# Patient Record
Sex: Female | Born: 1950
Health system: Southern US, Community
[De-identification: ages and names within clinical notes are randomized; demographics above are authoritative.]

## PROBLEM LIST (undated history)

## (undated) DIAGNOSIS — M8580 Other specified disorders of bone density and structure, unspecified site: Secondary | ICD-10-CM

## (undated) DIAGNOSIS — N3941 Urge incontinence: Secondary | ICD-10-CM

## (undated) DIAGNOSIS — K219 Gastro-esophageal reflux disease without esophagitis: Secondary | ICD-10-CM

## (undated) DIAGNOSIS — E7800 Pure hypercholesterolemia, unspecified: Secondary | ICD-10-CM

## (undated) DIAGNOSIS — E039 Hypothyroidism, unspecified: Secondary | ICD-10-CM

## (undated) DIAGNOSIS — K4090 Unilateral inguinal hernia, without obstruction or gangrene, not specified as recurrent: Secondary | ICD-10-CM

## (undated) DIAGNOSIS — N841 Polyp of cervix uteri: Secondary | ICD-10-CM

## (undated) HISTORY — DX: Other specified disorders of bone density and structure, unspecified site: M85.80

## (undated) HISTORY — DX: Hypothyroidism, unspecified: E03.9

## (undated) HISTORY — DX: Urge incontinence: N39.41

## (undated) HISTORY — PX: HERNIA REPAIR: SHX51

## (undated) HISTORY — DX: Pure hypercholesterolemia, unspecified: E78.00

## (undated) HISTORY — DX: Polyp of cervix uteri: N84.1

## (undated) HISTORY — DX: Gastro-esophageal reflux disease without esophagitis: K21.9

## (undated) HISTORY — DX: Unilateral inguinal hernia, without obstruction or gangrene, not specified as recurrent: K40.90

---

## 1998-08-13 ENCOUNTER — Encounter: Payer: Self-pay | Admitting: Family Medicine

## 1998-08-13 ENCOUNTER — Ambulatory Visit (HOSPITAL_COMMUNITY): Admission: RE | Admit: 1998-08-13 | Discharge: 1998-08-13 | Payer: Self-pay | Admitting: Family Medicine

## 2002-01-24 ENCOUNTER — Ambulatory Visit (HOSPITAL_COMMUNITY): Admission: RE | Admit: 2002-01-24 | Discharge: 2002-01-24 | Payer: Self-pay | Admitting: Family Medicine

## 2002-01-24 ENCOUNTER — Encounter: Payer: Self-pay | Admitting: Family Medicine

## 2003-06-20 ENCOUNTER — Emergency Department (HOSPITAL_COMMUNITY): Admission: EM | Admit: 2003-06-20 | Discharge: 2003-06-20 | Payer: Self-pay | Admitting: *Deleted

## 2004-09-12 ENCOUNTER — Other Ambulatory Visit: Admission: RE | Admit: 2004-09-12 | Discharge: 2004-09-12 | Payer: Self-pay | Admitting: Family Medicine

## 2005-11-27 ENCOUNTER — Other Ambulatory Visit: Admission: RE | Admit: 2005-11-27 | Discharge: 2005-11-27 | Payer: Self-pay | Admitting: Family Medicine

## 2011-05-28 ENCOUNTER — Other Ambulatory Visit (HOSPITAL_COMMUNITY)
Admission: RE | Admit: 2011-05-28 | Discharge: 2011-05-28 | Disposition: A | Discharge status: Home / Self Care | Payer: BC Managed Care – PPO | Source: Ambulatory Visit | Attending: Family Medicine | Admitting: Family Medicine

## 2011-05-28 DIAGNOSIS — Z0000 Encounter for general adult medical examination without abnormal findings: Secondary | ICD-10-CM | POA: Insufficient documentation

## 2015-01-22 ENCOUNTER — Other Ambulatory Visit (HOSPITAL_COMMUNITY)
Admission: RE | Admit: 2015-01-22 | Discharge: 2015-01-22 | Disposition: A | Discharge status: Home / Self Care | Payer: BLUE CROSS/BLUE SHIELD | Source: Ambulatory Visit | Attending: Family Medicine | Admitting: Family Medicine

## 2015-01-22 ENCOUNTER — Other Ambulatory Visit: Payer: Self-pay | Admitting: Family Medicine

## 2015-01-22 DIAGNOSIS — Z01419 Encounter for gynecological examination (general) (routine) without abnormal findings: Secondary | ICD-10-CM | POA: Insufficient documentation

## 2015-01-24 LAB — CYTOLOGY - PAP

## 2015-10-24 DIAGNOSIS — N3941 Urge incontinence: Secondary | ICD-10-CM | POA: Diagnosis not present

## 2015-10-24 DIAGNOSIS — Z23 Encounter for immunization: Secondary | ICD-10-CM | POA: Diagnosis not present

## 2015-10-24 DIAGNOSIS — E7800 Pure hypercholesterolemia, unspecified: Secondary | ICD-10-CM | POA: Diagnosis not present

## 2015-10-24 DIAGNOSIS — E039 Hypothyroidism, unspecified: Secondary | ICD-10-CM | POA: Diagnosis not present

## 2016-02-26 DIAGNOSIS — R0789 Other chest pain: Secondary | ICD-10-CM | POA: Diagnosis not present

## 2016-02-26 DIAGNOSIS — K219 Gastro-esophageal reflux disease without esophagitis: Secondary | ICD-10-CM | POA: Diagnosis not present

## 2016-02-26 DIAGNOSIS — Z23 Encounter for immunization: Secondary | ICD-10-CM | POA: Diagnosis not present

## 2016-02-28 ENCOUNTER — Telehealth: Payer: Self-pay

## 2016-02-28 NOTE — Telephone Encounter (Signed)
SENT NOTES TO SCHEDULING 02/28/16 RJ

## 2016-02-28 NOTE — Telephone Encounter (Signed)
SENT NOTES TO SCHEDULING 02/28/16 RJ 

## 2016-02-29 ENCOUNTER — Encounter: Payer: Self-pay | Admitting: Internal Medicine

## 2016-03-03 ENCOUNTER — Encounter: Payer: Self-pay | Admitting: Interventional Cardiology

## 2016-03-03 ENCOUNTER — Ambulatory Visit (INDEPENDENT_AMBULATORY_CARE_PROVIDER_SITE_OTHER): Payer: PPO | Admitting: Interventional Cardiology

## 2016-03-03 ENCOUNTER — Ambulatory Visit: Payer: BLUE CROSS/BLUE SHIELD | Admitting: Internal Medicine

## 2016-03-03 VITALS — BP 144/90 | HR 73 | Ht 63.0 in | Wt 206.4 lb

## 2016-03-03 DIAGNOSIS — Z8249 Family history of ischemic heart disease and other diseases of the circulatory system: Secondary | ICD-10-CM | POA: Diagnosis not present

## 2016-03-03 DIAGNOSIS — E785 Hyperlipidemia, unspecified: Secondary | ICD-10-CM | POA: Diagnosis not present

## 2016-03-03 DIAGNOSIS — K219 Gastro-esophageal reflux disease without esophagitis: Secondary | ICD-10-CM

## 2016-03-03 DIAGNOSIS — R112 Nausea with vomiting, unspecified: Secondary | ICD-10-CM

## 2016-03-03 DIAGNOSIS — R079 Chest pain, unspecified: Secondary | ICD-10-CM

## 2016-03-03 DIAGNOSIS — I10 Essential (primary) hypertension: Secondary | ICD-10-CM

## 2016-03-03 NOTE — Progress Notes (Signed)
Cardiology Office Note    Date:  03/03/2016   ID:  Jasmine SimmondsValarie D Dahm, DOB 22-Sep-1950, MRN [id number]  PCP:  No primary care provider on file.  Cardiologist: Lesleigh NoeHenry W Smith III, MD   Chief Complaint  Patient presents with  . Chest Pain    History of Present Illness:  Jasmine Jordan is a 65 y.o. female referred for evaluation of chest discomfort.  The chest discomfort is described as an upper substernal sensation of fullness. The discomfort is generally present at rest and after eating. When it is there, if she gets up and moves about the discomfort gets better. It has never been precipitated by physical activity. There are no associated symptoms such as arm discomfort, palpitations, or dyspnea. She is physically active. She has not had palpitations or syncope. The current symptom has been present intermittently for the past 2 months. She walks up to 3 miles per day and has had no difficulty with inciting the symptoms. She denies dyspnea.  Unassociated with the above chest discomfort, she had an episode of diaphoresis nausea and vomiting 2 weeks ago. It occurred spontaneously. Not associated with chest discomfort, dyspnea, or other complaint. It has not recurred since that time.    Past Medical History:  Diagnosis Date  . Gastroesophageal reflux disease without esophagitis   . Hypothyroidism   . Osteopenia    MILD  . Polyp of cervix uteri   . Pure hypercholesterolemia   . Unilateral inguinal hernia without obstruction or gangrene   . Urge incontinence     Past Surgical History:  Procedure Laterality Date  . HERNIA REPAIR     LEFT GROIN BTL    Current Medications: Outpatient Medications Prior to Visit  Medication Sig Dispense Refill  . atorvastatin (LIPITOR) 40 MG tablet Take 40 mg by mouth daily.    Marland Kitchen. levothyroxine (SYNTHROID, LEVOTHROID) 125 MCG tablet Take 125 mcg by mouth daily before breakfast.    . solifenacin (VESICARE) 10 MG tablet Take 10 mg by mouth daily.        No facility-administered medications prior to visit.      Allergies:   Patient has no known allergies.   Social History   Social History  . Marital status: Married    Spouse name: Gerlene BurdockRICHARD  . Number of children: 4  . Years of education: COLLEGE   Occupational History  . [employer]    Social History Main Topics  . Smoking status: Never Smoker  . Smokeless tobacco: Never Used  . Alcohol use Yes  . Drug use: No  . Sexual activity: Not Asked   Other Topics Concern  . None   Social History Narrative  . None     Family History:  The patient's family history includes Aneurysm in her maternal grandmother; Atrial fibrillation in her mother; CAD in her father and mother; COPD in her mother; Cancer in her maternal grandfather; Emphysema (age of onset: 6381) in her mother; Heart disease in her father and mother.   ROS:   Please see the history of present illness.    There is a history of gastroesophageal reflux. She has occasional reflux. She denies dysphagia. History of joint aches. History of osteopenia. Has been actively treating elevated lipids for greater than 10 years. They has a family history of premature vascular disease.  All other systems reviewed and are negative.   PHYSICAL EXAM:   VS:  BP (!) 144/90 (BP Location: Right Arm)   Pulse 73  Ht 5\' 3"  (1.6 m)   Wt 206 lb 6.4 oz (93.6 kg)   BMI 36.56 kg/m    GEN: Well nourished, well developed, in no acute distress . Moderate obesity. HEENT: normal  Neck: no JVD, carotid bruits, or masses Cardiac: RRR; no murmurs, rubs, or gallops,no edema  Respiratory:  clear to auscultation bilaterally, normal work of breathing GI: soft, nontender, nondistended, + BS MS: no deformity or atrophy  Skin: warm and dry, no rash Neuro:  Alert and Oriented x 3, Strength and sensation are intact Psych: euthymic mood, full affect  Wt Readings from Last 3 Encounters:  03/03/16 206 lb 6.4 oz (93.6 kg)      Studies/Labs Reviewed:    EKG:  EKG  Normal sinus rhythm, leftward axis, otherwise normal.  Recent Labs: No results found for requested labs within last 8760 hours.   Lipid Panel No results found for: CHOL, TRIG, HDL, CHOLHDL, VLDL, LDLCALC, LDLDIRECT  Additional studies/ records that were reviewed today include:  No cardiac data is available    ASSESSMENT:    1. Chest pain, unspecified type   2. Essential hypertension   3. Hyperlipidemia with target LDL less than 100   4. Family history of early CAD   5. Gastroesophageal reflux disease, esophagitis presence not specified   6. Nausea and vomiting, intractability of vomiting not specified, unspecified vomiting type      PLAN:  In order of problems listed above:  1. Chest discomfort is atypical for coronary disease. There are no features to suggest exertional angina. In fact her symptoms improve with physical activity. Episodes last less than 15 minutes. Given her family history and other risk factors including hyperlipidemia and hypertension, and excise treadmill test will be performed. 2. Blood pressure is elevated today. I discussed low-salt diet. We will see how the blood pressure does on treadmill. 3. Continue statin therapy. 4. Addressed above. 5. Not addressed.. 6. Not addressed but likely related to reflux.    Medication Adjustments/Labs and Tests Ordered: Current medicines are reviewed at length with the patient today.  Concerns regarding medicines are outlined above.  Medication changes, Labs and Tests ordered today are listed in the Patient Instructions below. Patient Instructions  Medication Instructions:  None  Labwork: None  Testing/Procedures: Your physician has requested that you have an exercise tolerance test. For further information please visit [email or web address]. Please also follow instruction sheet, as given.   Follow-Up: Your physician recommends that you schedule a follow-up appointment as needed with Dr. Katrinka BlazingSmith.    Any Other Special Instructions Will Be Listed Below (If Applicable).     If you need a refill on your cardiac medications before your next appointment, please call your pharmacy.      Signed, Lesleigh NoeHenry W Smith III, MD  03/03/2016 3:57 PM    [facility] [address], [place], [place]  [address] Phone: [phone]; Fax: [phone]

## 2016-03-03 NOTE — Patient Instructions (Signed)
Medication Instructions:  None  Labwork: None  Testing/Procedures: Your physician has requested that you have an exercise tolerance test. For further information please visit [email or web address]. Please also follow instruction sheet, as given.   Follow-Up: Your physician recommends that you schedule a follow-up appointment as needed with Dr. Smith.    Any Other Special Instructions Will Be Listed Below (If Applicable).     If you need a refill on your cardiac medications before your next appointment, please call your pharmacy.   

## 2016-03-17 ENCOUNTER — Ambulatory Visit (INDEPENDENT_AMBULATORY_CARE_PROVIDER_SITE_OTHER): Payer: PPO

## 2016-03-17 DIAGNOSIS — R079 Chest pain, unspecified: Secondary | ICD-10-CM | POA: Diagnosis not present

## 2016-03-21 ENCOUNTER — Telehealth: Payer: Self-pay | Admitting: Interventional Cardiology

## 2016-03-21 LAB — EXERCISE TOLERANCE TEST
CHL CUP MPHR: 155 {beats}/min
CHL CUP RESTING HR STRESS: 65 {beats}/min
CSEPEDS: 27 s
CSEPEW: 7.6 METS
CSEPHR: 94 %
CSEPPHR: 146 {beats}/min
Exercise duration (min): 6 min
RPE: 13

## 2016-03-21 NOTE — Telephone Encounter (Signed)
New message   Pt verbalized that she is calling for th results for the 12/4 monitor

## 2016-03-21 NOTE — Telephone Encounter (Signed)
Left message for pt letting her know I will call as soon as I have those results.  Pt is calling about ETT results. Not sure if they were ever sent to you.  They are available in EPIC.

## 2016-03-25 ENCOUNTER — Telehealth: Payer: Self-pay | Admitting: *Deleted

## 2016-03-25 MED ORDER — METOPROLOL SUCCINATE ER 25 MG PO TB24: 25.0000 mg | ORAL_TABLET | Freq: Every day | ORAL | 3 refills | Status: DC

## 2016-03-25 NOTE — Telephone Encounter (Signed)
Ptcb and has been notified of ETT results and findings by phone with verbal understanding to plan of care and recommendations. Pt is agreeable to start Metoprolol Succinate 25 mg daily; Rx sent in. Pt scheduled to see Bary CastillaKaty Thompson, PA 04/29/16 @ 8:30 to f/u on Metoprolol per Dr. Katrinka BlazingSmith.

## 2016-03-25 NOTE — Telephone Encounter (Signed)
Notes Recorded by Tarri Fullerarol M Fiato, CMA on 03/25/2016 at 3:19 PM EST Ptcb and has been notified of ETT results and findings by phone with verbal understanding to plan of care and recommendations. Pt is agreeable to start Metoprolol Succinate 25 mg daily; Rx sent in. Pt scheduled to see Bary CastillaKaty Thompson, PA 04/29/16 @ 8:30 to f/u on Metoprolol per Dr. Katrinka BlazingSmith.

## 2016-04-25 NOTE — Progress Notes (Signed)
Cardiology Office Note    Date:  04/29/2016   ID:  Jasmine Jordan, DOB 12-Feb-1951, MRN [id number]  PCP:  Allean Found, MD  Cardiologist:  Dr. Katrinka Blazing   CC: chest pain follow up   History of Present Illness:  Jasmine Jordan is a 66 y.o. female with a history of HTN, HLD, GERD and family hx of early CAD who presents to clinic for follow up.   She was seen as a new patient by Dr. Katrinka Blazing on 03/03/16 for evaluation of chest pain. The chest pain was felt be atypical and actually got better with exertion. She underwent GXT on 03/21/16 which showed no evidence of ischemia but hypertensive BP response to exercise. She was started on Toprol XL 25mg  daily.  Today she presents to clinic for follow up. She has been doing very well. She has been walking a lot and wears a pedometer. Trys to get 10,500 steps a day. Also work out at [employer] two times a week. She is raising her 2 grandsons which has been stressful. No more chest pain or SOB. No LE edema, orthopnea or PND. No dizziness or syncope. No blood in stool or urine. No palpitations.     Past Medical History:  Diagnosis Date  . Gastroesophageal reflux disease without esophagitis   . Hypothyroidism   . Osteopenia    MILD  . Polyp of cervix uteri   . Pure hypercholesterolemia   . Unilateral inguinal hernia without obstruction or gangrene   . Urge incontinence     Past Surgical History:  Procedure Laterality Date  . HERNIA REPAIR     LEFT GROIN BTL    Current Medications: Outpatient Medications Prior to Visit  Medication Sig Dispense Refill  . atorvastatin (LIPITOR) 40 MG tablet Take 40 mg by mouth daily.    Marland Kitchen levothyroxine (SYNTHROID, LEVOTHROID) 125 MCG tablet Take 125 mcg by mouth daily before breakfast.    . metoprolol succinate (TOPROL-XL) 25 MG 24 hr tablet Take 1 tablet (25 mg total) by mouth daily. 90 tablet 3  . solifenacin (VESICARE) 10 MG tablet Take 10 mg by mouth daily.     Marland Kitchen omeprazole (PRILOSEC) 20 MG capsule  Take 20 mg by mouth daily.     No facility-administered medications prior to visit.      Allergies:   Patient has no known allergies.   Social History   Social History  . Marital status: Married    Spouse name: Gerlene Burdock  . Number of children: 4  . Years of education: COLLEGE   Occupational History  . [employer]    Social History Main Topics  . Smoking status: Never Smoker  . Smokeless tobacco: Never Used  . Alcohol use Yes  . Drug use: No  . Sexual activity: Not on file   Other Topics Concern  . Not on file   Social History Narrative  . No narrative on file     Family History:  The patient's family history includes Aneurysm in her maternal grandmother; Atrial fibrillation in her mother; CAD in her father and mother; COPD in her mother; Cancer in her maternal grandfather; Emphysema (age of onset: 5) in her mother; Heart disease in her father and mother.      ROS:   Please see the history of present illness.    ROS All other systems reviewed and are negative.   PHYSICAL EXAM:   VS:  BP 136/90   Pulse (!) 57   Ht 5'  3" (1.6 m)   Wt 208 lb 12.8 oz (94.7 kg)   BMI 36.99 kg/m    GEN: Well nourished, well developed, in no acute distress  HEENT: normal  Neck: no JVD, carotid bruits, or masses Cardiac: RRR; no murmurs, rubs, or gallops,no edema  Respiratory:  clear to auscultation bilaterally, normal work of breathing GI: soft, nontender, nondistended, + BS MS: no deformity or atrophy  Skin: warm and dry, no rash Neuro:  Alert and Oriented x 3, Strength and sensation are intact Psych: euthymic mood, full affect  Wt Readings from Last 3 Encounters:  04/29/16 208 lb 12.8 oz (94.7 kg)  03/03/16 206 lb 6.4 oz (93.6 kg)      Studies/Labs Reviewed:   EKG:  EKG is NOT ordered today.  Recent Labs: No results found for requested labs within last 8760 hours.   Lipid Panel No results found for: CHOL, TRIG, HDL, CHOLHDL, VLDL, LDLCALC, LDLDIRECT  Additional  studies/ records that were reviewed today include:  GXT 03/2016 Study Highlights    Blood pressure demonstrated a hypertensive response to exercise.  There was no ST segment deviation noted during stress.  No T wave inversion was noted during stress.    No evidence of myocardial ischemia.  Hypertensive blood pressure response.       ASSESSMENT & PLAN:   Chest pain: no recurrence.   HTN: Bp borderline today. She is doing well on Toprol XL 25 mg daily. She would like to work on diet and exercise vs adding another agent, which I think is reasonable. She will follow up with PCP regarding BP.  HLD: cont statin  GERD: recently stopped omeprazole and starting a new PPI, not sure of name  Medication Adjustments/Labs and Tests Ordered: Current medicines are reviewed at length with the patient today.  Concerns regarding medicines are outlined above.  Medication changes, Labs and Tests ordered today are listed in the Patient Instructions below. Patient Instructions  Medication Instructions:  Your physician recommends that you continue on your current medications as directed. Please refer to the Current Medication list given to you today.   Labwork: None ordered  Testing/Procedures: None ordered  Follow-Up: Your physician recommends that you schedule a follow-up appointment in: AS NEEDED    Any Other Special Instructions Will Be Listed Below (If Applicable).     If you need a refill on your cardiac medications before your next appointment, please call your pharmacy.      Signed, Cline CrockKathryn Thompson, PA-C  04/29/2016 9:42 AM    [facility] [address], [place], [place]  [address] Phone: [phone]; Fax: [phone]

## 2016-04-28 DIAGNOSIS — K219 Gastro-esophageal reflux disease without esophagitis: Secondary | ICD-10-CM | POA: Diagnosis not present

## 2016-04-29 ENCOUNTER — Ambulatory Visit (INDEPENDENT_AMBULATORY_CARE_PROVIDER_SITE_OTHER): Payer: PPO | Admitting: Physician Assistant

## 2016-04-29 VITALS — BP 136/90 | HR 57 | Ht 63.0 in | Wt 208.8 lb

## 2016-04-29 DIAGNOSIS — I10 Essential (primary) hypertension: Secondary | ICD-10-CM | POA: Diagnosis not present

## 2016-04-29 DIAGNOSIS — R079 Chest pain, unspecified: Secondary | ICD-10-CM | POA: Diagnosis not present

## 2016-04-29 DIAGNOSIS — K219 Gastro-esophageal reflux disease without esophagitis: Secondary | ICD-10-CM | POA: Diagnosis not present

## 2016-04-29 DIAGNOSIS — E785 Hyperlipidemia, unspecified: Secondary | ICD-10-CM | POA: Diagnosis not present

## 2016-04-29 NOTE — Patient Instructions (Addendum)
Medication Instructions:  Your physician recommends that you continue on your current medications as directed. Please refer to the Current Medication list given to you today.   Labwork: None ordered  Testing/Procedures: None ordered  Follow-Up: Your physician recommends that you schedule a follow-up appointment in: AS NEEDED   Any Other Special Instructions Will Be Listed Below (If Applicable).     If you need a refill on your cardiac medications before your next appointment, please call your pharmacy.   

## 2016-06-18 DIAGNOSIS — Z0000 Encounter for general adult medical examination without abnormal findings: Secondary | ICD-10-CM | POA: Diagnosis not present

## 2016-06-18 DIAGNOSIS — E039 Hypothyroidism, unspecified: Secondary | ICD-10-CM | POA: Diagnosis not present

## 2016-06-18 DIAGNOSIS — R030 Elevated blood-pressure reading, without diagnosis of hypertension: Secondary | ICD-10-CM | POA: Diagnosis not present

## 2016-06-18 DIAGNOSIS — E7800 Pure hypercholesterolemia, unspecified: Secondary | ICD-10-CM | POA: Diagnosis not present

## 2016-06-18 DIAGNOSIS — Z8249 Family history of ischemic heart disease and other diseases of the circulatory system: Secondary | ICD-10-CM | POA: Diagnosis not present

## 2016-06-18 DIAGNOSIS — M79604 Pain in right leg: Secondary | ICD-10-CM | POA: Diagnosis not present

## 2016-06-18 DIAGNOSIS — Z1389 Encounter for screening for other disorder: Secondary | ICD-10-CM | POA: Diagnosis not present

## 2016-06-18 DIAGNOSIS — K219 Gastro-esophageal reflux disease without esophagitis: Secondary | ICD-10-CM | POA: Diagnosis not present

## 2016-06-18 DIAGNOSIS — N3281 Overactive bladder: Secondary | ICD-10-CM | POA: Diagnosis not present

## 2016-06-26 ENCOUNTER — Encounter (INDEPENDENT_AMBULATORY_CARE_PROVIDER_SITE_OTHER): Payer: Self-pay | Admitting: Orthopaedic Surgery

## 2016-06-26 ENCOUNTER — Ambulatory Visit (INDEPENDENT_AMBULATORY_CARE_PROVIDER_SITE_OTHER): Payer: PPO

## 2016-06-26 ENCOUNTER — Ambulatory Visit (INDEPENDENT_AMBULATORY_CARE_PROVIDER_SITE_OTHER): Payer: PPO | Admitting: Orthopaedic Surgery

## 2016-06-26 VITALS — Ht 62.5 in | Wt 204.0 lb

## 2016-06-26 DIAGNOSIS — M79661 Pain in right lower leg: Secondary | ICD-10-CM | POA: Diagnosis not present

## 2016-06-26 NOTE — Progress Notes (Signed)
Office Visit Note   Patient: Jasmine Jordan           Date of Birth: May 16, 1950           MRN: [id number] Visit Date: 06/26/2016              Requested by: Merri Brunetteandace Smith, MD [phone] [place], [place] [address] PCP: Allean FoundSMITH,CANDACE THIELE, MD   Assessment & Plan: Visit Diagnoses:  1. Pain in right lower leg     Plan: Right knee degenerative joint disease. Right knee injection was performed today. Follow-up with me as needed.  Follow-Up Instructions: Return if symptoms worsen or fail to improve.   Orders:  Orders Placed This Encounter  Procedures  . XR Tibia/Fibula Right   No orders of the defined types were placed in this encounter.     Procedures: Large Joint Inj Date/Time: 06/26/2016 11:36 AM Performed by: Tarry KosXU, NAIPING M Authorized by: Tarry KosXU, NAIPING M   Consent Given by:  Patient Timeout: prior to procedure the correct patient, procedure, and site was verified   Indications:  Pain Location:  Knee Site:  R knee Prep: patient was prepped and draped in usual sterile fashion   Needle Size:  22 G Ultrasound Guidance: No   Fluoroscopic Guidance: No   Arthrogram: No   Patient tolerance:  Patient tolerated the procedure well with no immediate complications     Clinical Data: No additional findings.   Subjective: Chief Complaint  Patient presents with  . Right Lower Leg - Pain    Is a 66 year old female with right knee and leg pain for about 6 months. She endorses aching pain down into the bone. She endorses popping and giving way and swelling. Denies numbness and tingling. Pain is worse with activity and better with rest. She's been taking encounter arthritis medicines.    Review of Systems  Constitutional: Negative.   HENT: Negative.   Eyes: Negative.   Respiratory: Negative.   Cardiovascular: Negative.   Endocrine: Negative.   Musculoskeletal: Negative.   Neurological: Negative.   Hematological: Negative.   Psychiatric/Behavioral:  Negative.   All other systems reviewed and are negative.    Objective: Vital Signs: Ht 5' 2.5" (1.588 m)   Wt 204 lb (92.5 kg)   BMI 36.72 kg/m   Physical Exam  Constitutional: She is oriented to person, place, and time. She appears well-developed and well-nourished.  HENT:  Head: Normocephalic and atraumatic.  Eyes: EOM are normal.  Neck: Neck supple.  Pulmonary/Chest: Effort normal.  Abdominal: Soft.  Neurological: She is alert and oriented to person, place, and time.  Skin: Skin is warm. Capillary refill takes less than 2 seconds.  Psychiatric: She has a normal mood and affect. Her behavior is normal. Judgment and thought content normal.  Nursing note and vitals reviewed.   Ortho Exam Exam of the right knee shows no joint effusion. She has good range of motion with significant patellar crepitus. Collaterals and cruciates are stable. Specialty Comments:  No specialty comments available.  Imaging: Xr Tibia/fibula Right  Result Date: 06/26/2016 Degenerative joint disease of the right knee. No acute for structural findings.    PMFS History: There are no active problems to display for this patient.  Past Medical History:  Diagnosis Date  . Gastroesophageal reflux disease without esophagitis   . Hypothyroidism   . Osteopenia    MILD  . Polyp of cervix uteri   . Pure hypercholesterolemia   . Unilateral inguinal hernia without  obstruction or gangrene   . Urge incontinence     Family History  Problem Relation Age of Onset  . Emphysema Mother 60  . Atrial fibrillation Mother   . Heart disease Mother   . CAD Mother   . COPD Mother   . Heart disease Father     HIT BY A TRAIN  . CAD Father   . Aneurysm Maternal Grandmother   . Cancer Maternal Grandfather     Past Surgical History:  Procedure Laterality Date  . HERNIA REPAIR     LEFT GROIN BTL   Social History   Occupational History  . [employer]    Social History Main Topics  . Smoking status: Never  Smoker  . Smokeless tobacco: Never Used  . Alcohol use Yes  . Drug use: No  . Sexual activity: Not on file

## 2017-01-05 ENCOUNTER — Other Ambulatory Visit: Payer: Self-pay | Admitting: Interventional Cardiology

## 2017-05-07 DIAGNOSIS — J069 Acute upper respiratory infection, unspecified: Secondary | ICD-10-CM | POA: Diagnosis not present

## 2018-02-05 ENCOUNTER — Other Ambulatory Visit: Payer: Self-pay | Admitting: Interventional Cardiology

## 2018-02-09 DIAGNOSIS — K219 Gastro-esophageal reflux disease without esophagitis: Secondary | ICD-10-CM | POA: Diagnosis not present

## 2018-02-09 DIAGNOSIS — Z1239 Encounter for other screening for malignant neoplasm of breast: Secondary | ICD-10-CM | POA: Diagnosis not present

## 2018-02-09 DIAGNOSIS — E039 Hypothyroidism, unspecified: Secondary | ICD-10-CM | POA: Diagnosis not present

## 2018-02-09 DIAGNOSIS — E7800 Pure hypercholesterolemia, unspecified: Secondary | ICD-10-CM | POA: Diagnosis not present

## 2018-02-09 DIAGNOSIS — Z1211 Encounter for screening for malignant neoplasm of colon: Secondary | ICD-10-CM | POA: Diagnosis not present

## 2018-02-09 DIAGNOSIS — R030 Elevated blood-pressure reading, without diagnosis of hypertension: Secondary | ICD-10-CM | POA: Diagnosis not present

## 2018-02-09 DIAGNOSIS — N3281 Overactive bladder: Secondary | ICD-10-CM | POA: Diagnosis not present

## 2018-02-09 DIAGNOSIS — Z23 Encounter for immunization: Secondary | ICD-10-CM | POA: Diagnosis not present

## 2018-02-16 DIAGNOSIS — Z1231 Encounter for screening mammogram for malignant neoplasm of breast: Secondary | ICD-10-CM | POA: Diagnosis not present

## 2018-04-13 DIAGNOSIS — I10 Essential (primary) hypertension: Secondary | ICD-10-CM | POA: Diagnosis not present

## 2018-05-04 DIAGNOSIS — I10 Essential (primary) hypertension: Secondary | ICD-10-CM | POA: Diagnosis not present

## 2018-06-10 DIAGNOSIS — I10 Essential (primary) hypertension: Secondary | ICD-10-CM | POA: Diagnosis not present

## 2018-08-06 DIAGNOSIS — I10 Essential (primary) hypertension: Secondary | ICD-10-CM | POA: Diagnosis not present

## 2018-08-06 DIAGNOSIS — E039 Hypothyroidism, unspecified: Secondary | ICD-10-CM | POA: Diagnosis not present

## 2018-08-06 DIAGNOSIS — E7800 Pure hypercholesterolemia, unspecified: Secondary | ICD-10-CM | POA: Diagnosis not present

## 2018-09-01 DIAGNOSIS — E039 Hypothyroidism, unspecified: Secondary | ICD-10-CM | POA: Diagnosis not present

## 2018-09-01 DIAGNOSIS — E7800 Pure hypercholesterolemia, unspecified: Secondary | ICD-10-CM | POA: Diagnosis not present

## 2018-09-01 DIAGNOSIS — I10 Essential (primary) hypertension: Secondary | ICD-10-CM | POA: Diagnosis not present

## 2018-11-09 DIAGNOSIS — E039 Hypothyroidism, unspecified: Secondary | ICD-10-CM | POA: Diagnosis not present

## 2018-11-09 DIAGNOSIS — I10 Essential (primary) hypertension: Secondary | ICD-10-CM | POA: Diagnosis not present

## 2018-11-09 DIAGNOSIS — E7800 Pure hypercholesterolemia, unspecified: Secondary | ICD-10-CM | POA: Diagnosis not present

## 2018-12-13 DIAGNOSIS — I10 Essential (primary) hypertension: Secondary | ICD-10-CM | POA: Diagnosis not present

## 2018-12-13 DIAGNOSIS — E7800 Pure hypercholesterolemia, unspecified: Secondary | ICD-10-CM | POA: Diagnosis not present

## 2018-12-13 DIAGNOSIS — E039 Hypothyroidism, unspecified: Secondary | ICD-10-CM | POA: Diagnosis not present

## 2019-01-11 DIAGNOSIS — E7800 Pure hypercholesterolemia, unspecified: Secondary | ICD-10-CM | POA: Diagnosis not present

## 2019-01-11 DIAGNOSIS — E039 Hypothyroidism, unspecified: Secondary | ICD-10-CM | POA: Diagnosis not present

## 2019-01-11 DIAGNOSIS — I10 Essential (primary) hypertension: Secondary | ICD-10-CM | POA: Diagnosis not present

## 2019-02-01 DIAGNOSIS — I10 Essential (primary) hypertension: Secondary | ICD-10-CM | POA: Diagnosis not present

## 2019-02-01 DIAGNOSIS — E7800 Pure hypercholesterolemia, unspecified: Secondary | ICD-10-CM | POA: Diagnosis not present

## 2019-02-01 DIAGNOSIS — E039 Hypothyroidism, unspecified: Secondary | ICD-10-CM | POA: Diagnosis not present

## 2019-02-08 DIAGNOSIS — K219 Gastro-esophageal reflux disease without esophagitis: Secondary | ICD-10-CM | POA: Diagnosis not present

## 2019-02-08 DIAGNOSIS — E7800 Pure hypercholesterolemia, unspecified: Secondary | ICD-10-CM | POA: Diagnosis not present

## 2019-02-08 DIAGNOSIS — N3281 Overactive bladder: Secondary | ICD-10-CM | POA: Diagnosis not present

## 2019-02-08 DIAGNOSIS — I10 Essential (primary) hypertension: Secondary | ICD-10-CM | POA: Diagnosis not present

## 2019-02-08 DIAGNOSIS — Z0000 Encounter for general adult medical examination without abnormal findings: Secondary | ICD-10-CM | POA: Diagnosis not present

## 2019-02-08 DIAGNOSIS — Z1389 Encounter for screening for other disorder: Secondary | ICD-10-CM | POA: Diagnosis not present

## 2019-02-08 DIAGNOSIS — E039 Hypothyroidism, unspecified: Secondary | ICD-10-CM | POA: Diagnosis not present

## 2019-02-10 DIAGNOSIS — E7800 Pure hypercholesterolemia, unspecified: Secondary | ICD-10-CM | POA: Diagnosis not present

## 2019-02-10 DIAGNOSIS — I10 Essential (primary) hypertension: Secondary | ICD-10-CM | POA: Diagnosis not present

## 2019-02-10 DIAGNOSIS — E039 Hypothyroidism, unspecified: Secondary | ICD-10-CM | POA: Diagnosis not present

## 2019-02-22 DIAGNOSIS — Z1231 Encounter for screening mammogram for malignant neoplasm of breast: Secondary | ICD-10-CM | POA: Diagnosis not present

## 2019-03-09 DIAGNOSIS — E039 Hypothyroidism, unspecified: Secondary | ICD-10-CM | POA: Diagnosis not present

## 2019-03-09 DIAGNOSIS — E7800 Pure hypercholesterolemia, unspecified: Secondary | ICD-10-CM | POA: Diagnosis not present

## 2019-03-09 DIAGNOSIS — I10 Essential (primary) hypertension: Secondary | ICD-10-CM | POA: Diagnosis not present

## 2019-03-17 DIAGNOSIS — I10 Essential (primary) hypertension: Secondary | ICD-10-CM | POA: Diagnosis not present

## 2019-04-06 DIAGNOSIS — I10 Essential (primary) hypertension: Secondary | ICD-10-CM | POA: Diagnosis not present

## 2019-04-08 ENCOUNTER — Emergency Department (HOSPITAL_COMMUNITY): Payer: PPO

## 2019-04-08 ENCOUNTER — Encounter (HOSPITAL_COMMUNITY): Payer: Self-pay | Admitting: *Deleted

## 2019-04-08 ENCOUNTER — Other Ambulatory Visit: Payer: Self-pay

## 2019-04-08 ENCOUNTER — Emergency Department (HOSPITAL_COMMUNITY)
Admission: EM | Admit: 2019-04-08 | Discharge: 2019-04-08 | Disposition: A | Discharge status: Home / Self Care | Payer: PPO | Attending: Emergency Medicine | Admitting: Emergency Medicine

## 2019-04-08 DIAGNOSIS — Y929 Unspecified place or not applicable: Secondary | ICD-10-CM | POA: Insufficient documentation

## 2019-04-08 DIAGNOSIS — W010XXA Fall on same level from slipping, tripping and stumbling without subsequent striking against object, initial encounter: Secondary | ICD-10-CM | POA: Diagnosis not present

## 2019-04-08 DIAGNOSIS — Y999 Unspecified external cause status: Secondary | ICD-10-CM | POA: Insufficient documentation

## 2019-04-08 DIAGNOSIS — Y939 Activity, unspecified: Secondary | ICD-10-CM | POA: Diagnosis not present

## 2019-04-08 DIAGNOSIS — S8001XA Contusion of right knee, initial encounter: Secondary | ICD-10-CM | POA: Diagnosis not present

## 2019-04-08 DIAGNOSIS — Z79899 Other long term (current) drug therapy: Secondary | ICD-10-CM | POA: Diagnosis not present

## 2019-04-08 DIAGNOSIS — E039 Hypothyroidism, unspecified: Secondary | ICD-10-CM | POA: Diagnosis not present

## 2019-04-08 DIAGNOSIS — M79661 Pain in right lower leg: Secondary | ICD-10-CM | POA: Diagnosis not present

## 2019-04-08 DIAGNOSIS — S8991XA Unspecified injury of right lower leg, initial encounter: Secondary | ICD-10-CM | POA: Diagnosis not present

## 2019-04-08 DIAGNOSIS — M7989 Other specified soft tissue disorders: Secondary | ICD-10-CM | POA: Diagnosis not present

## 2019-04-08 DIAGNOSIS — S8011XA Contusion of right lower leg, initial encounter: Secondary | ICD-10-CM | POA: Diagnosis not present

## 2019-04-08 DIAGNOSIS — R233 Spontaneous ecchymoses: Secondary | ICD-10-CM

## 2019-04-08 NOTE — Discharge Instructions (Addendum)
Your xray showed arthritis and a small amount of fluid in your knee. There was no evidence of a blood clot in your R leg. There does not appear to be any emergency associated with your spontaneous bruising. Make sure to elevate the leg to reduce swelling.  Return to the emergency department if you are leg becomes severely swollen, red, hot to the touch, if you have fevers or chills.  If you develop new numbness tingling severe pain out of proportion to the initial injury, chest pain or shortness of breath.

## 2019-04-08 NOTE — ED Provider Notes (Signed)
[facility] EMERGENCY DEPARTMENT Provider Note   CSN: [id number] Arrival date & time: 04/08/19  1321     History Chief Complaint  Patient presents with  . Fall    Jasmine Jordan is a 68 y.o. female with a past medical history of reflux, hypothyroidism, osteopenia who presents emergency department chief complaint of right knee injury and leg swelling.  Patient states that she tripped and fell directly onto her right knee 1 week ago.  She states that she has had some swelling and bruising over the anterior surface of the knee which is resolving.  She states "I figured it was not broken because I was able to walk on it."  She is been taking some Tylenol which has been providing pain relief.  This morning when she awoke she noticed that the posterior aspect of her right calf was markedly bruised.  She has no new injuries.  She has noticed a sense of heaviness, tightness and swelling of the right lower extremity which have all come up suddenly this morning.  She denies a history of blood clots, DVTs or PEs.  She denies any chest pain or shortness of breath.  She denies numbness or paresthesia.  HPI     Past Medical History:  Diagnosis Date  . Gastroesophageal reflux disease without esophagitis   . Hypothyroidism   . Osteopenia    MILD  . Polyp of cervix uteri   . Pure hypercholesterolemia   . Unilateral inguinal hernia without obstruction or gangrene   . Urge incontinence     There are no problems to display for this patient.   Past Surgical History:  Procedure Laterality Date  . HERNIA REPAIR     LEFT GROIN BTL     OB History   No obstetric history on file.     Family History  Problem Relation Age of Onset  . Emphysema Mother 43  . Atrial fibrillation Mother   . Heart disease Mother   . CAD Mother   . COPD Mother   . Heart disease Father        HIT BY A TRAIN  . CAD Father   . Aneurysm Maternal Grandmother   . Cancer Maternal Grandfather     Social History     Tobacco Use  . Smoking status: Never Smoker  . Smokeless tobacco: Never Used  Substance Use Topics  . Alcohol use: Yes  . Drug use: No    Home Medications Prior to Admission medications   Medication Sig Start Date End Date Taking? Authorizing Provider  atorvastatin (LIPITOR) 40 MG tablet Take 40 mg by mouth daily.    [provider]  levothyroxine (SYNTHROID, LEVOTHROID) 125 MCG tablet Take 125 mcg by mouth daily before breakfast.    [provider]  metoprolol succinate (TOPROL-XL) 25 MG 24 hr tablet Take 1 tablet (25 mg total) by mouth daily. Please make overdue appt with Dr. Tamala Julian for future refills. [phone]. 1st attempt. 02/05/18   Belva Crome, MD  pantoprazole (PROTONIX) 40 MG tablet Take 40 mg by mouth daily. 04/28/16   [provider]  solifenacin (VESICARE) 10 MG tablet Take 10 mg by mouth daily.     [provider]    Allergies    Patient has no known allergies.  Review of Systems   Review of Systems Ten systems reviewed and are negative for acute change, except as noted in the HPI.   Physical Exam Updated Vital Signs BP 124/86 (BP Location:  Left Arm)   Pulse 73   Temp 98.1 F (36.7 C)   Resp 15   Ht 5\' 2"  (1.575 m)   Wt 97.5 kg   SpO2 100%   BMI 39.32 kg/m   Physical Exam Vitals and nursing note reviewed.  Constitutional:      General: She is not in acute distress.    Appearance: She is well-developed. She is not diaphoretic.  HENT:     Head: Normocephalic and atraumatic.  Eyes:     General: No scleral icterus.    Conjunctiva/sclera: Conjunctivae normal.  Cardiovascular:     Rate and Rhythm: Normal rate and regular rhythm.     Heart sounds: Normal heart sounds. No murmur. No friction rub. No gallop.   Pulmonary:     Effort: Pulmonary effort is normal. No respiratory distress.     Breath sounds: Normal breath sounds.  Abdominal:     General: Bowel sounds are normal. There is no distension.     Palpations:  Abdomen is soft. There is no mass.     Tenderness: There is no abdominal tenderness. There is no guarding.  Musculoskeletal:     Cervical back: Normal range of motion.     Comments: Right knee examination:.  Mild effusion with probable ballottement.  Old, yellowed ecchymotic changes over the anterior surface of the skin.  Mild medial joint line tenderness.  Full active range of motion, ligaments appear stable.  Negative anterior posterior drawer test.  Right calf with deep, purple ecchymosis of the posterior calf.  Compartments soft.  Right circumferential swelling of the lower extremity as compared to left, no pitting edema, normal DP and PT pulse, normal sensation.  Skin:    General: Skin is warm and dry.  Neurological:     Mental Status: She is alert and oriented to person, place, and time.  Psychiatric:        Behavior: Behavior normal.     ED Results / Procedures / Treatments   Labs (all labs ordered are listed, but only abnormal results are displayed) Labs Reviewed - No data to display  EKG None  Radiology No results found.  Procedures Procedures (including critical care time)  Medications Ordered in ED Medications - No data to display  ED Course  I have reviewed the triage vital signs and the nursing notes.  Pertinent labs & imaging results that were available during my care of the patient were reviewed by me and considered in my medical decision making (see chart for details).    MDM Rules/Calculators/A&P                      68 year old female who presents for evaluation of right lower extremity swelling and knee swelling after fall.  I personally reviewed the patient's 4 view plain film of the right knee which show some osteoarthritis and effusion on my interpretation.  No acute fractures or dislocations.  The right lower extremity ultrasound is negative for DVT.  Patient encouraged to follow closely with her primary care physician.  She appears appropriate for  discharge at this time.  Discussed return precautions Final Clinical Impression(s) / ED Diagnoses Final diagnoses:  None    Rx / DC Orders ED Discharge Orders    None       73, PA-C 04/08/19 2307    2308, MD 04/09/19 [phone]

## 2019-04-08 NOTE — ED Triage Notes (Signed)
Patient comes to the ED with right leg pain and bruising from a fall onto concrete one week ago.  The bruising is new.  Patient feels her leg feels "tighter" than usual.

## 2019-04-14 DIAGNOSIS — I10 Essential (primary) hypertension: Secondary | ICD-10-CM | POA: Diagnosis not present

## 2019-04-14 DIAGNOSIS — E039 Hypothyroidism, unspecified: Secondary | ICD-10-CM | POA: Diagnosis not present

## 2019-04-14 DIAGNOSIS — E7800 Pure hypercholesterolemia, unspecified: Secondary | ICD-10-CM | POA: Diagnosis not present

## 2019-05-12 DIAGNOSIS — E039 Hypothyroidism, unspecified: Secondary | ICD-10-CM | POA: Diagnosis not present

## 2019-05-12 DIAGNOSIS — I10 Essential (primary) hypertension: Secondary | ICD-10-CM | POA: Diagnosis not present

## 2019-05-12 DIAGNOSIS — E7800 Pure hypercholesterolemia, unspecified: Secondary | ICD-10-CM | POA: Diagnosis not present

## 2019-06-02 DIAGNOSIS — E7800 Pure hypercholesterolemia, unspecified: Secondary | ICD-10-CM | POA: Diagnosis not present

## 2019-06-02 DIAGNOSIS — E039 Hypothyroidism, unspecified: Secondary | ICD-10-CM | POA: Diagnosis not present

## 2019-06-02 DIAGNOSIS — I10 Essential (primary) hypertension: Secondary | ICD-10-CM | POA: Diagnosis not present

## 2019-06-10 DIAGNOSIS — I10 Essential (primary) hypertension: Secondary | ICD-10-CM | POA: Diagnosis not present

## 2019-06-12 DIAGNOSIS — E7800 Pure hypercholesterolemia, unspecified: Secondary | ICD-10-CM | POA: Diagnosis not present

## 2019-06-12 DIAGNOSIS — E039 Hypothyroidism, unspecified: Secondary | ICD-10-CM | POA: Diagnosis not present

## 2019-06-12 DIAGNOSIS — I10 Essential (primary) hypertension: Secondary | ICD-10-CM | POA: Diagnosis not present

## 2019-07-11 DIAGNOSIS — I10 Essential (primary) hypertension: Secondary | ICD-10-CM | POA: Diagnosis not present

## 2019-07-11 DIAGNOSIS — E7800 Pure hypercholesterolemia, unspecified: Secondary | ICD-10-CM | POA: Diagnosis not present

## 2019-07-11 DIAGNOSIS — E039 Hypothyroidism, unspecified: Secondary | ICD-10-CM | POA: Diagnosis not present

## 2019-07-12 DIAGNOSIS — I10 Essential (primary) hypertension: Secondary | ICD-10-CM | POA: Diagnosis not present

## 2019-08-05 DIAGNOSIS — E039 Hypothyroidism, unspecified: Secondary | ICD-10-CM | POA: Diagnosis not present

## 2019-08-05 DIAGNOSIS — I10 Essential (primary) hypertension: Secondary | ICD-10-CM | POA: Diagnosis not present

## 2019-08-08 DIAGNOSIS — I10 Essential (primary) hypertension: Secondary | ICD-10-CM | POA: Diagnosis not present

## 2019-08-08 DIAGNOSIS — E7800 Pure hypercholesterolemia, unspecified: Secondary | ICD-10-CM | POA: Diagnosis not present

## 2019-08-08 DIAGNOSIS — E039 Hypothyroidism, unspecified: Secondary | ICD-10-CM | POA: Diagnosis not present

## 2019-08-11 DIAGNOSIS — I10 Essential (primary) hypertension: Secondary | ICD-10-CM | POA: Diagnosis not present

## 2019-09-08 DIAGNOSIS — E039 Hypothyroidism, unspecified: Secondary | ICD-10-CM | POA: Diagnosis not present

## 2019-09-08 DIAGNOSIS — E7800 Pure hypercholesterolemia, unspecified: Secondary | ICD-10-CM | POA: Diagnosis not present

## 2019-09-08 DIAGNOSIS — I10 Essential (primary) hypertension: Secondary | ICD-10-CM | POA: Diagnosis not present

## 2019-10-05 DIAGNOSIS — E7800 Pure hypercholesterolemia, unspecified: Secondary | ICD-10-CM | POA: Diagnosis not present

## 2019-10-05 DIAGNOSIS — E039 Hypothyroidism, unspecified: Secondary | ICD-10-CM | POA: Diagnosis not present

## 2019-10-05 DIAGNOSIS — I10 Essential (primary) hypertension: Secondary | ICD-10-CM | POA: Diagnosis not present

## 2019-11-10 DIAGNOSIS — I10 Essential (primary) hypertension: Secondary | ICD-10-CM | POA: Diagnosis not present

## 2019-11-10 DIAGNOSIS — E7800 Pure hypercholesterolemia, unspecified: Secondary | ICD-10-CM | POA: Diagnosis not present

## 2019-11-10 DIAGNOSIS — E039 Hypothyroidism, unspecified: Secondary | ICD-10-CM | POA: Diagnosis not present

## 2019-11-12 DIAGNOSIS — Z20828 Contact with and (suspected) exposure to other viral communicable diseases: Secondary | ICD-10-CM | POA: Diagnosis not present

## 2019-12-08 DIAGNOSIS — E7800 Pure hypercholesterolemia, unspecified: Secondary | ICD-10-CM | POA: Diagnosis not present

## 2019-12-08 DIAGNOSIS — I10 Essential (primary) hypertension: Secondary | ICD-10-CM | POA: Diagnosis not present

## 2019-12-08 DIAGNOSIS — E039 Hypothyroidism, unspecified: Secondary | ICD-10-CM | POA: Diagnosis not present

## 2020-01-10 DIAGNOSIS — E039 Hypothyroidism, unspecified: Secondary | ICD-10-CM | POA: Diagnosis not present

## 2020-01-10 DIAGNOSIS — E7800 Pure hypercholesterolemia, unspecified: Secondary | ICD-10-CM | POA: Diagnosis not present

## 2020-01-10 DIAGNOSIS — I10 Essential (primary) hypertension: Secondary | ICD-10-CM | POA: Diagnosis not present

## 2020-02-10 DIAGNOSIS — E039 Hypothyroidism, unspecified: Secondary | ICD-10-CM | POA: Diagnosis not present

## 2020-02-10 DIAGNOSIS — E7800 Pure hypercholesterolemia, unspecified: Secondary | ICD-10-CM | POA: Diagnosis not present

## 2020-02-10 DIAGNOSIS — I10 Essential (primary) hypertension: Secondary | ICD-10-CM | POA: Diagnosis not present

## 2020-03-06 DIAGNOSIS — E039 Hypothyroidism, unspecified: Secondary | ICD-10-CM | POA: Diagnosis not present

## 2020-03-06 DIAGNOSIS — I10 Essential (primary) hypertension: Secondary | ICD-10-CM | POA: Diagnosis not present

## 2020-03-06 DIAGNOSIS — K219 Gastro-esophageal reflux disease without esophagitis: Secondary | ICD-10-CM | POA: Diagnosis not present

## 2020-03-06 DIAGNOSIS — E7800 Pure hypercholesterolemia, unspecified: Secondary | ICD-10-CM | POA: Diagnosis not present

## 2020-04-11 DIAGNOSIS — E039 Hypothyroidism, unspecified: Secondary | ICD-10-CM | POA: Diagnosis not present

## 2020-04-11 DIAGNOSIS — I10 Essential (primary) hypertension: Secondary | ICD-10-CM | POA: Diagnosis not present

## 2020-04-11 DIAGNOSIS — K219 Gastro-esophageal reflux disease without esophagitis: Secondary | ICD-10-CM | POA: Diagnosis not present

## 2020-04-11 DIAGNOSIS — E7800 Pure hypercholesterolemia, unspecified: Secondary | ICD-10-CM | POA: Diagnosis not present

## 2020-05-10 DIAGNOSIS — E039 Hypothyroidism, unspecified: Secondary | ICD-10-CM | POA: Diagnosis not present

## 2020-05-10 DIAGNOSIS — K219 Gastro-esophageal reflux disease without esophagitis: Secondary | ICD-10-CM | POA: Diagnosis not present

## 2020-05-10 DIAGNOSIS — I10 Essential (primary) hypertension: Secondary | ICD-10-CM | POA: Diagnosis not present

## 2020-05-10 DIAGNOSIS — E7800 Pure hypercholesterolemia, unspecified: Secondary | ICD-10-CM | POA: Diagnosis not present

## 2020-06-08 DIAGNOSIS — E7800 Pure hypercholesterolemia, unspecified: Secondary | ICD-10-CM | POA: Diagnosis not present

## 2020-06-08 DIAGNOSIS — E039 Hypothyroidism, unspecified: Secondary | ICD-10-CM | POA: Diagnosis not present

## 2020-06-08 DIAGNOSIS — I10 Essential (primary) hypertension: Secondary | ICD-10-CM | POA: Diagnosis not present

## 2020-06-08 DIAGNOSIS — K219 Gastro-esophageal reflux disease without esophagitis: Secondary | ICD-10-CM | POA: Diagnosis not present

## 2020-07-19 DIAGNOSIS — K219 Gastro-esophageal reflux disease without esophagitis: Secondary | ICD-10-CM | POA: Diagnosis not present

## 2020-07-19 DIAGNOSIS — I10 Essential (primary) hypertension: Secondary | ICD-10-CM | POA: Diagnosis not present

## 2020-07-19 DIAGNOSIS — E039 Hypothyroidism, unspecified: Secondary | ICD-10-CM | POA: Diagnosis not present

## 2020-07-19 DIAGNOSIS — E7800 Pure hypercholesterolemia, unspecified: Secondary | ICD-10-CM | POA: Diagnosis not present

## 2020-09-04 DIAGNOSIS — I10 Essential (primary) hypertension: Secondary | ICD-10-CM | POA: Diagnosis not present

## 2020-09-04 DIAGNOSIS — K219 Gastro-esophageal reflux disease without esophagitis: Secondary | ICD-10-CM | POA: Diagnosis not present

## 2020-09-04 DIAGNOSIS — E7800 Pure hypercholesterolemia, unspecified: Secondary | ICD-10-CM | POA: Diagnosis not present

## 2020-09-04 DIAGNOSIS — E039 Hypothyroidism, unspecified: Secondary | ICD-10-CM | POA: Diagnosis not present

## 2020-09-24 DIAGNOSIS — E039 Hypothyroidism, unspecified: Secondary | ICD-10-CM | POA: Diagnosis not present

## 2020-09-24 DIAGNOSIS — I10 Essential (primary) hypertension: Secondary | ICD-10-CM | POA: Diagnosis not present

## 2020-09-24 DIAGNOSIS — K219 Gastro-esophageal reflux disease without esophagitis: Secondary | ICD-10-CM | POA: Diagnosis not present

## 2020-09-24 DIAGNOSIS — E7800 Pure hypercholesterolemia, unspecified: Secondary | ICD-10-CM | POA: Diagnosis not present

## 2020-10-14 IMAGING — US US EXTREM LOW VENOUS*R*
1 series · 13 of 24 positions shown · non-contrast
Comparison: None.

CLINICAL DATA: One-week history of RIGHT lower extremity pain and
swelling. Recent fall onto the RIGHT knee.



[Series 1: us extrem low venous*right* · 0.08mm/px · 13 of 52 slices shown]
[im 1/52]
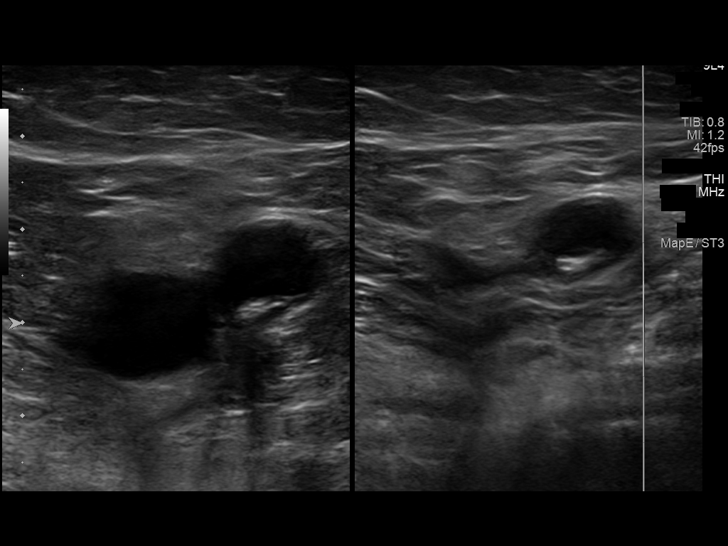
[im 5/52]
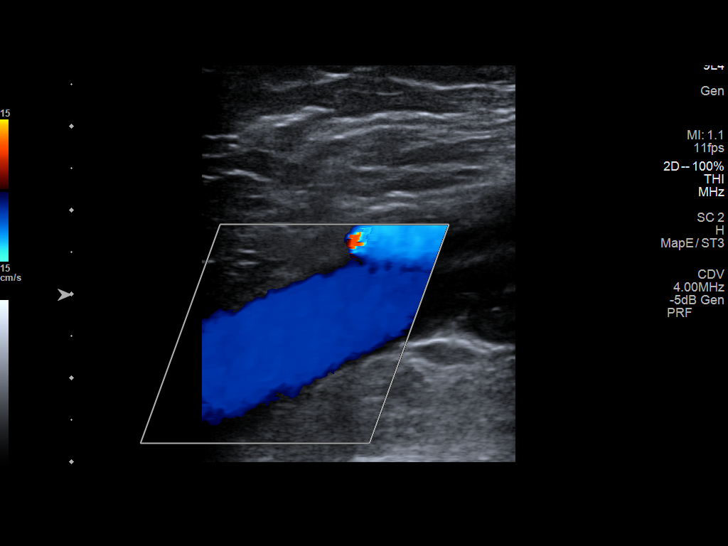
[im 9/52]
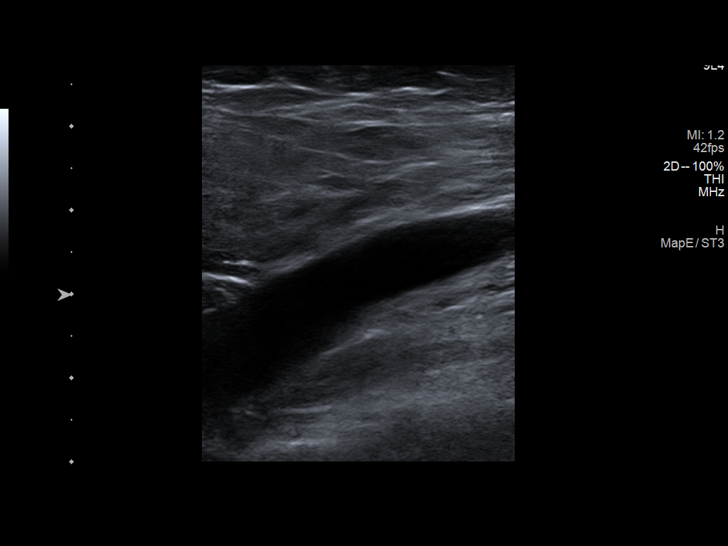
[im 14/52]
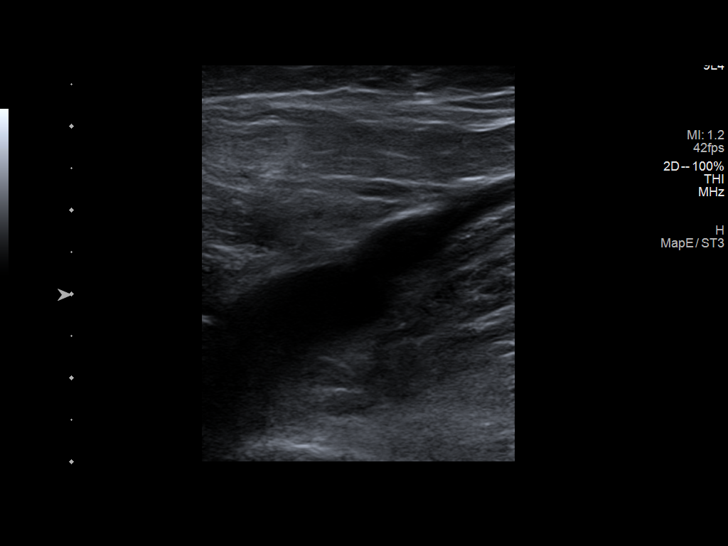
[im 18/52]
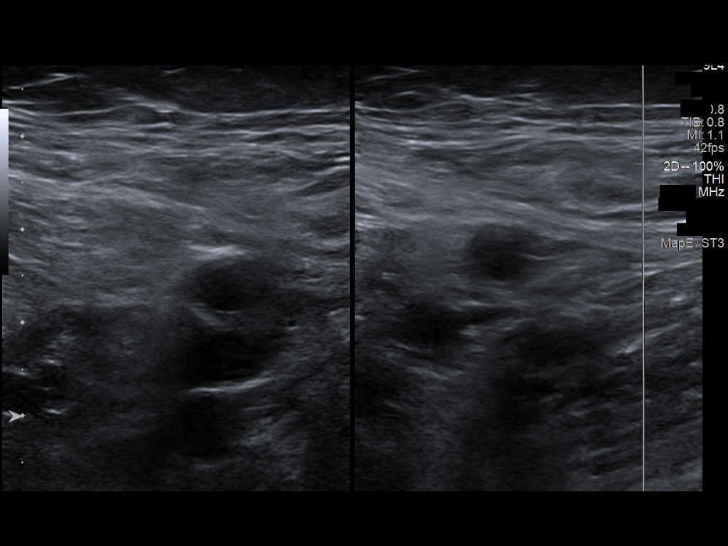
[im 23/52]
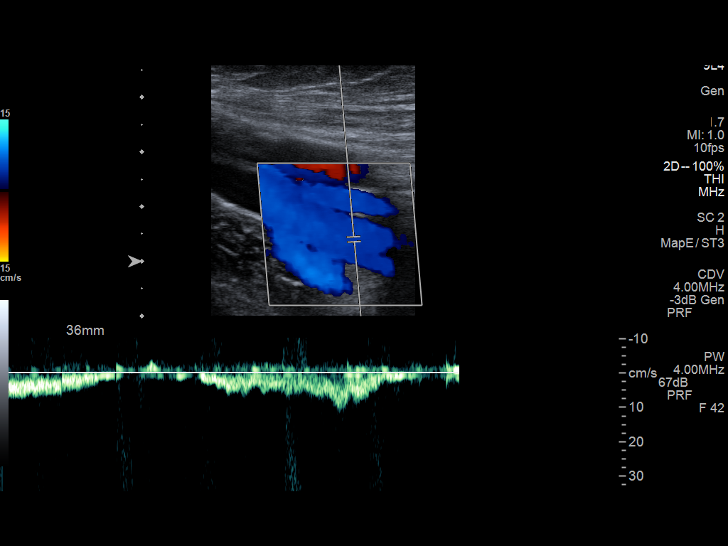
[im 27/52]
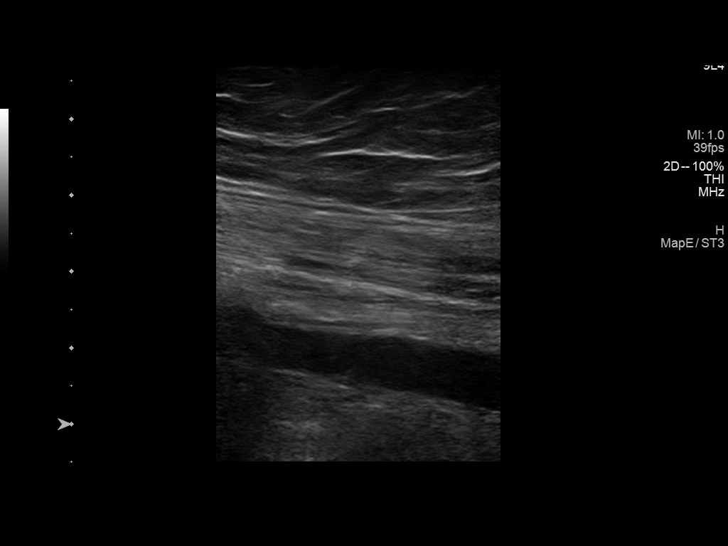
[im 29/52]
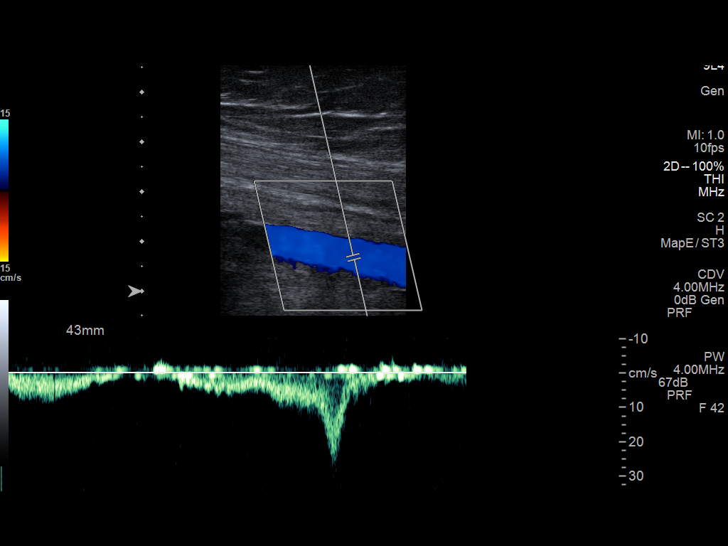
[im 34/52]
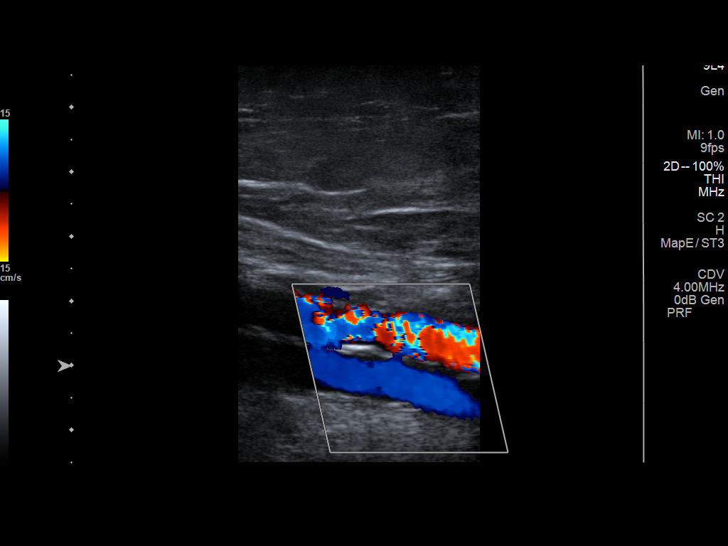
[im 38/52]
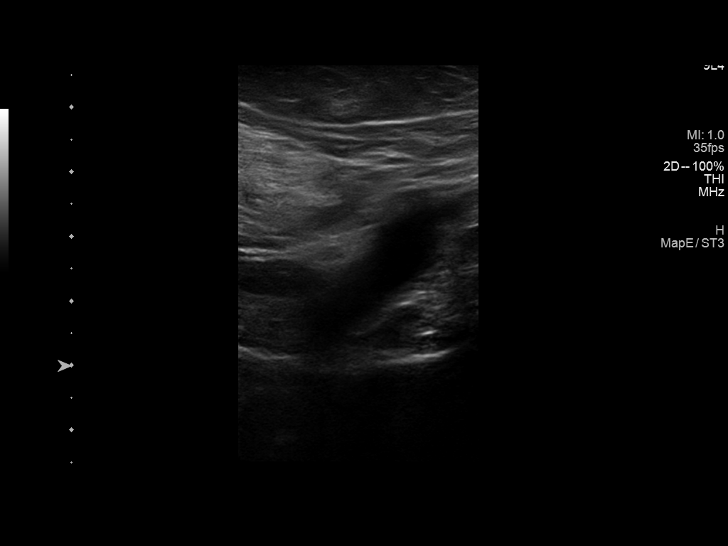
[im 43/52]
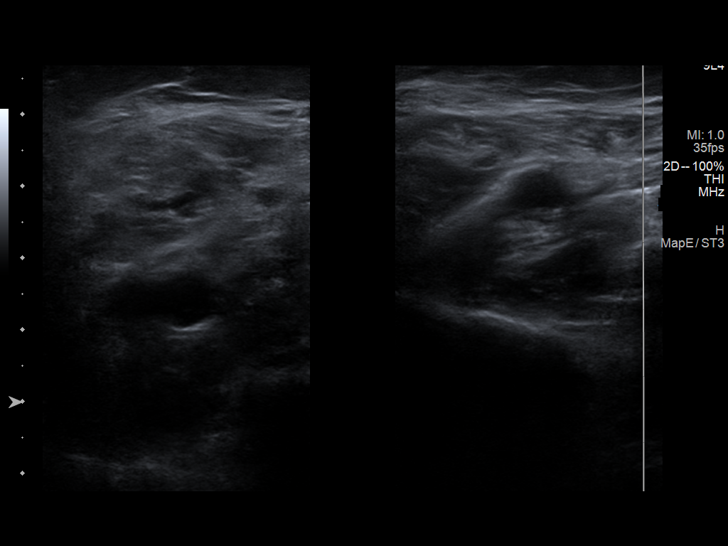
[im 47/52]
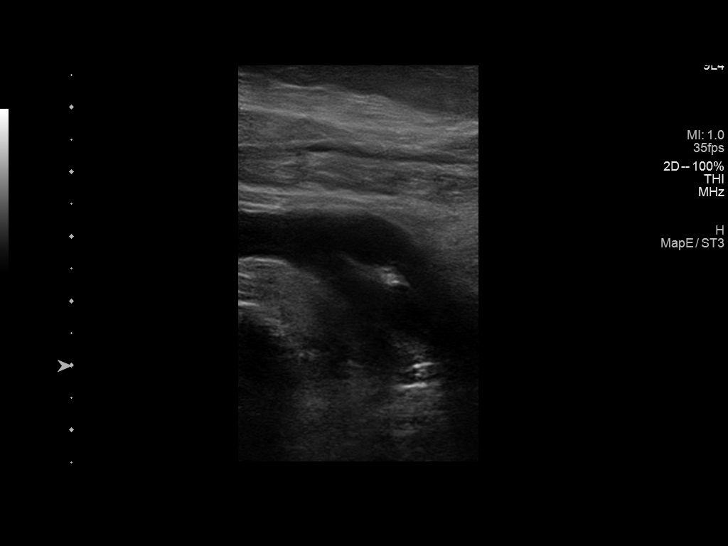
[im 52/52]
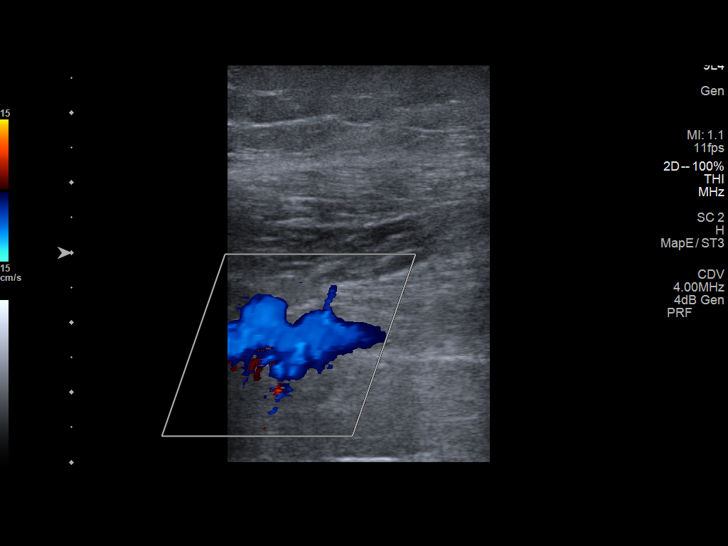

[13 of 24 positions shown; findings below may reference images not displayed]

FINDINGS: Contralateral LEFT Common Femoral Vein: Respiratory phasicity is
normal and symmetric with the symptomatic side. No evidence of
thrombus. Normal compressibility.

RIGHT Common Femoral Vein: No evidence of thrombus. Normal
compressibility, respiratory phasicity and response to augmentation.

Saphenofemoral Junction: No evidence of thrombus. Normal
compressibility and flow on color Doppler imaging.

Profunda Femoral Vein: No evidence of thrombus. Normal
compressibility and flow on color Doppler imaging.

Femoral Vein: No evidence of thrombus. Normal compressibility,
respiratory phasicity and response to augmentation.

Popliteal Vein: No evidence of thrombus. Normal compressibility,
respiratory phasicity and response to augmentation.

Calf Veins: No evidence of thrombus. Normal compressibility and flow
on color Doppler imaging.

Superficial Great Saphenous Vein: No evidence of thrombus. Normal
compressibility.

Venous Reflux:  Not evaluated

Other Findings:  None.
IMPRESSION: No evidence of RIGHT lower extremity DVT.

## 2020-10-14 IMAGING — DX DG KNEE COMPLETE 4+V*R*
4 series · 4 of 4 positions shown · non-contrast
Comparison: RIGHT tibia-fibula 06/26/2016. No prior knee imaging.

CLINICAL DATA: 68-year-old who tripped and fell, striking her RIGHT
knee on concrete. Pain and bruising. Initial encounter.

EXAM:
RIGHT KNEE - COMPLETE 4+ VIEW

[knee ap]
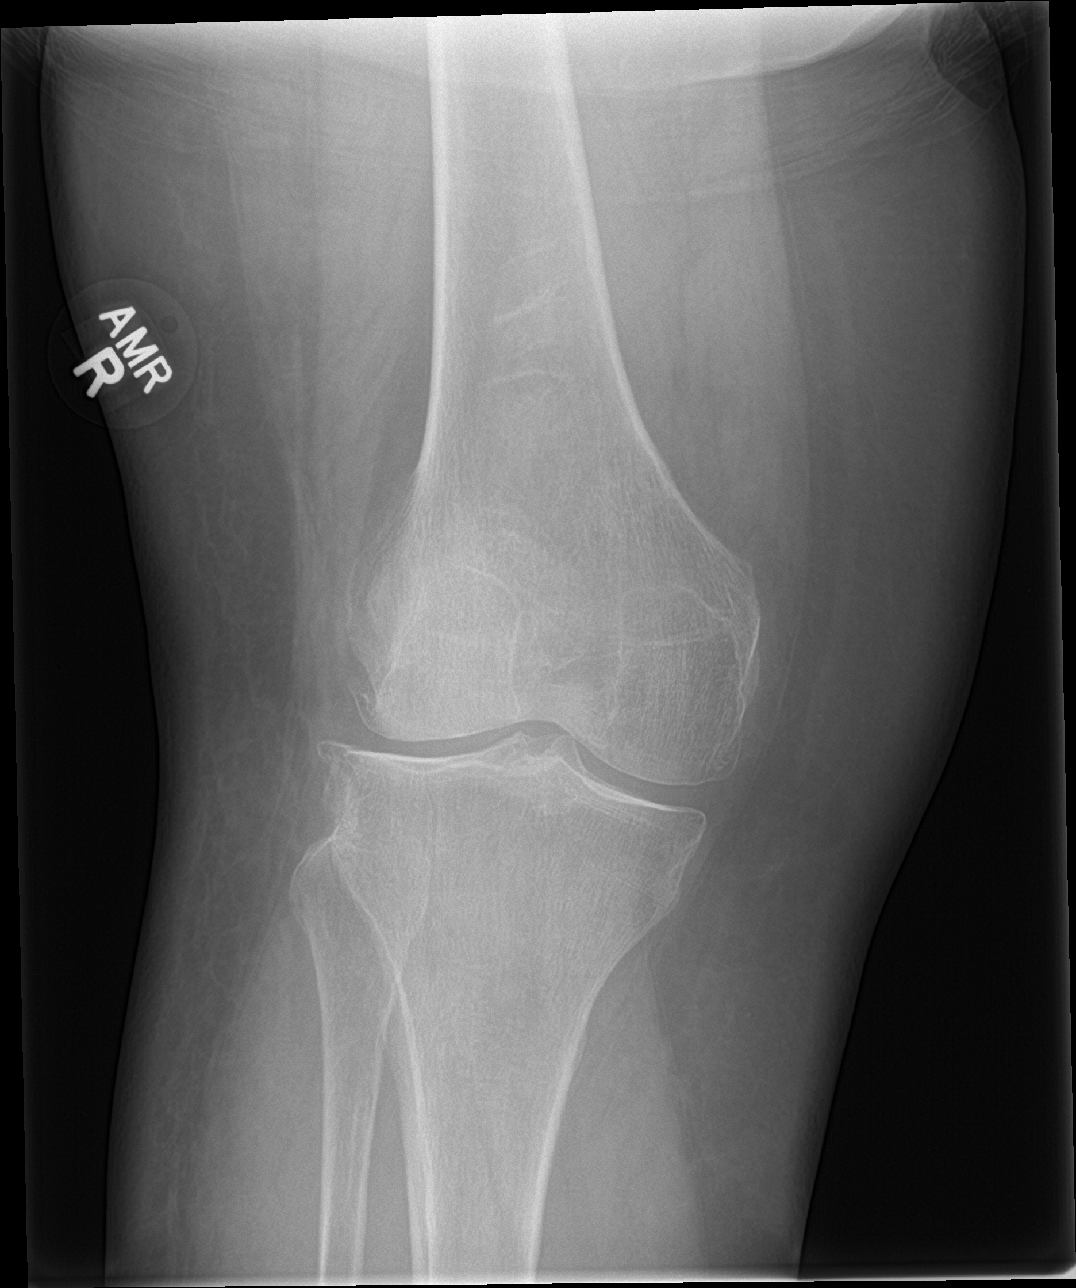

[knee obl (1 of 2)]
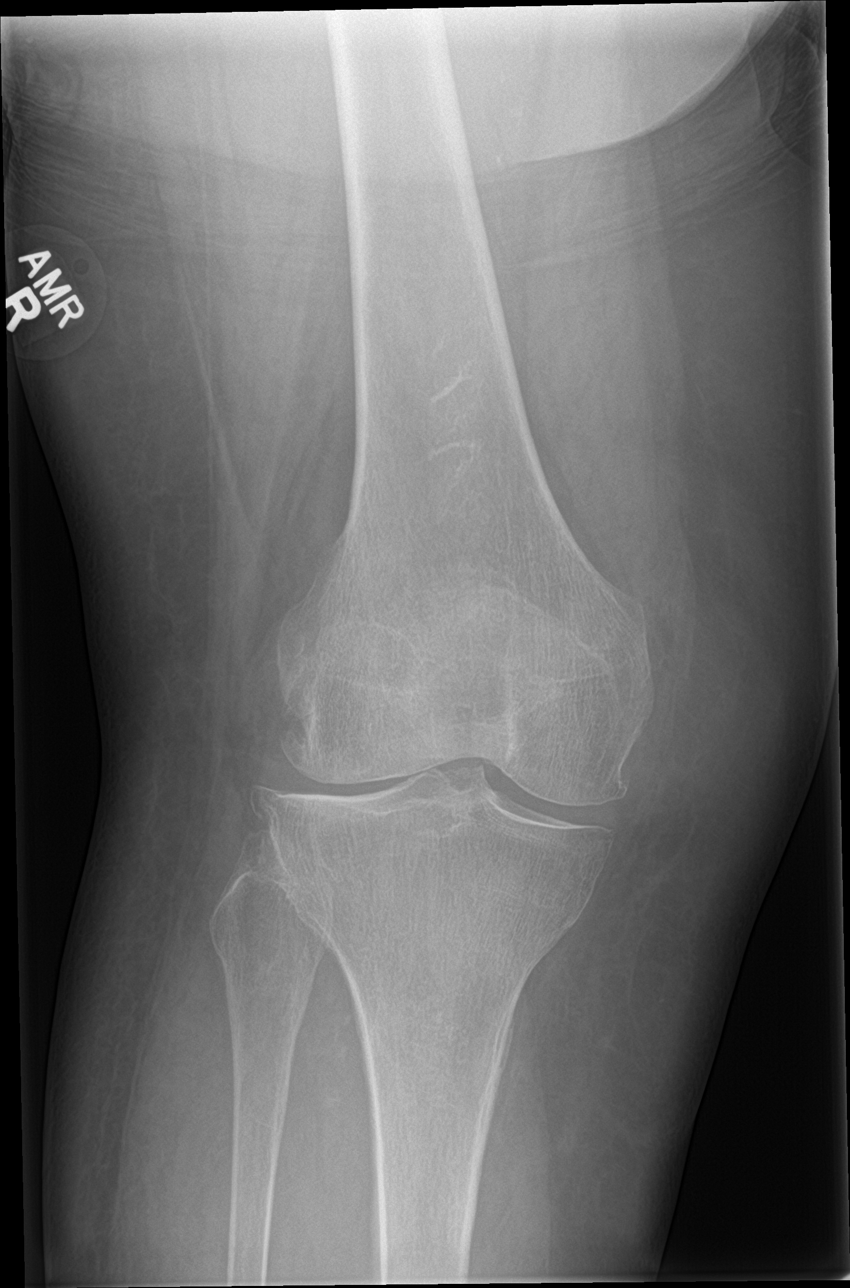

[knee obl (2 of 2)]
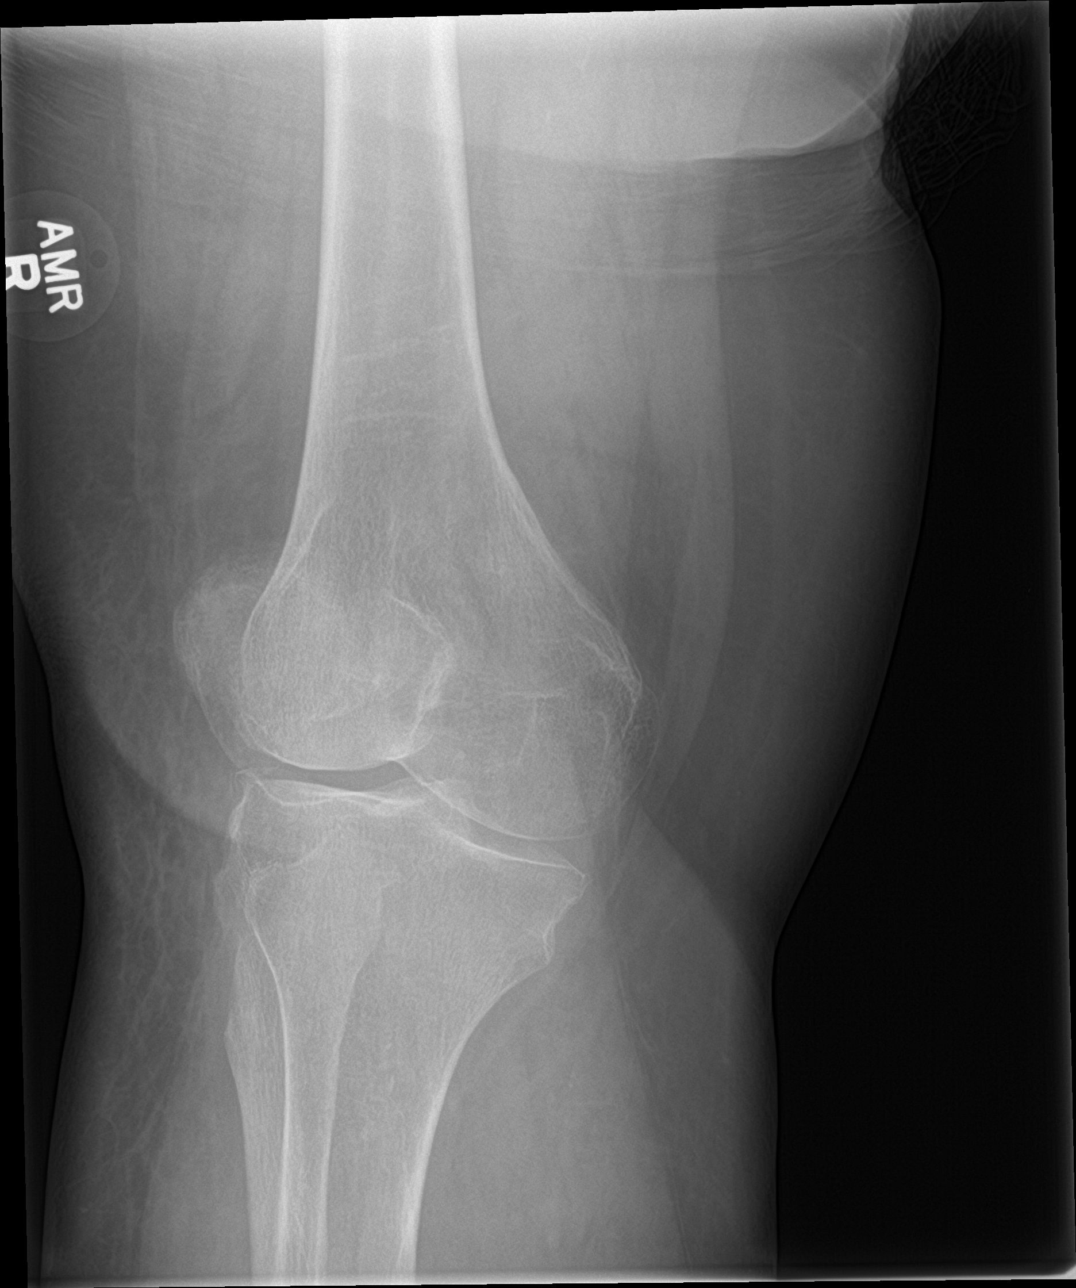

[knee lat]
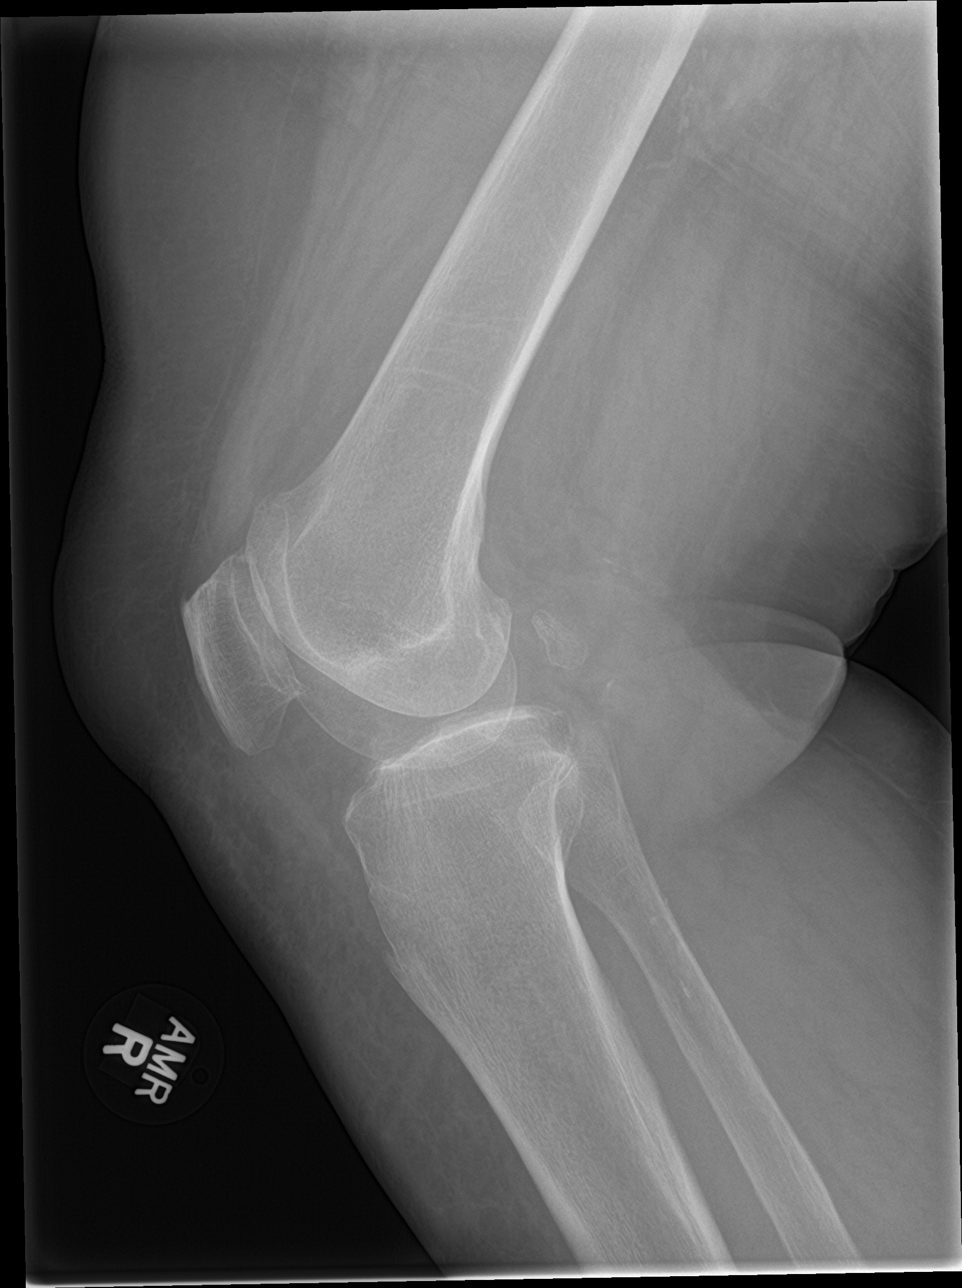

[4 of 4 positions shown; findings below may reference images not displayed]

FINDINGS: No evidence of acute fracture or dislocation. Mild osseous
demineralization. Moderate MEDIAL compartment joint space narrowing
and associated spurring. Mild LATERAL and patellofemoral compartment
joint space narrowing. Possible small joint effusion.
IMPRESSION: 1. No acute osseous abnormality.
2. Mild to moderate tricompartment osteoarthritis, worst in the
MEDIAL compartment.
3. Possible small joint effusion.

## 2020-12-03 DIAGNOSIS — E039 Hypothyroidism, unspecified: Secondary | ICD-10-CM | POA: Diagnosis not present

## 2020-12-03 DIAGNOSIS — I10 Essential (primary) hypertension: Secondary | ICD-10-CM | POA: Diagnosis not present

## 2020-12-03 DIAGNOSIS — K219 Gastro-esophageal reflux disease without esophagitis: Secondary | ICD-10-CM | POA: Diagnosis not present

## 2020-12-03 DIAGNOSIS — E7800 Pure hypercholesterolemia, unspecified: Secondary | ICD-10-CM | POA: Diagnosis not present

## 2021-01-04 DIAGNOSIS — E039 Hypothyroidism, unspecified: Secondary | ICD-10-CM | POA: Diagnosis not present

## 2021-01-04 DIAGNOSIS — K219 Gastro-esophageal reflux disease without esophagitis: Secondary | ICD-10-CM | POA: Diagnosis not present

## 2021-01-04 DIAGNOSIS — I10 Essential (primary) hypertension: Secondary | ICD-10-CM | POA: Diagnosis not present

## 2021-01-04 DIAGNOSIS — E7800 Pure hypercholesterolemia, unspecified: Secondary | ICD-10-CM | POA: Diagnosis not present

## 2021-03-05 DIAGNOSIS — K219 Gastro-esophageal reflux disease without esophagitis: Secondary | ICD-10-CM | POA: Diagnosis not present

## 2021-03-05 DIAGNOSIS — E7800 Pure hypercholesterolemia, unspecified: Secondary | ICD-10-CM | POA: Diagnosis not present

## 2021-03-05 DIAGNOSIS — I10 Essential (primary) hypertension: Secondary | ICD-10-CM | POA: Diagnosis not present

## 2021-03-05 DIAGNOSIS — E039 Hypothyroidism, unspecified: Secondary | ICD-10-CM | POA: Diagnosis not present

## 2021-04-02 DIAGNOSIS — I10 Essential (primary) hypertension: Secondary | ICD-10-CM | POA: Diagnosis not present

## 2021-04-02 DIAGNOSIS — E7800 Pure hypercholesterolemia, unspecified: Secondary | ICD-10-CM | POA: Diagnosis not present

## 2021-04-02 DIAGNOSIS — E039 Hypothyroidism, unspecified: Secondary | ICD-10-CM | POA: Diagnosis not present

## 2021-05-27 DIAGNOSIS — E7800 Pure hypercholesterolemia, unspecified: Secondary | ICD-10-CM | POA: Diagnosis not present

## 2021-05-27 DIAGNOSIS — I10 Essential (primary) hypertension: Secondary | ICD-10-CM | POA: Diagnosis not present

## 2021-05-27 DIAGNOSIS — E039 Hypothyroidism, unspecified: Secondary | ICD-10-CM | POA: Diagnosis not present

## 2021-05-30 DIAGNOSIS — E039 Hypothyroidism, unspecified: Secondary | ICD-10-CM | POA: Diagnosis not present

## 2021-06-26 DIAGNOSIS — E7800 Pure hypercholesterolemia, unspecified: Secondary | ICD-10-CM | POA: Diagnosis not present

## 2021-06-26 DIAGNOSIS — I10 Essential (primary) hypertension: Secondary | ICD-10-CM | POA: Diagnosis not present

## 2021-11-05 DIAGNOSIS — Z23 Encounter for immunization: Secondary | ICD-10-CM | POA: Diagnosis not present

## 2021-11-05 DIAGNOSIS — Z1211 Encounter for screening for malignant neoplasm of colon: Secondary | ICD-10-CM | POA: Diagnosis not present

## 2021-11-05 DIAGNOSIS — N3281 Overactive bladder: Secondary | ICD-10-CM | POA: Diagnosis not present

## 2021-11-05 DIAGNOSIS — Z1331 Encounter for screening for depression: Secondary | ICD-10-CM | POA: Diagnosis not present

## 2021-11-05 DIAGNOSIS — E7800 Pure hypercholesterolemia, unspecified: Secondary | ICD-10-CM | POA: Diagnosis not present

## 2021-11-05 DIAGNOSIS — E039 Hypothyroidism, unspecified: Secondary | ICD-10-CM | POA: Diagnosis not present

## 2021-11-05 DIAGNOSIS — Z1159 Encounter for screening for other viral diseases: Secondary | ICD-10-CM | POA: Diagnosis not present

## 2021-11-05 DIAGNOSIS — I10 Essential (primary) hypertension: Secondary | ICD-10-CM | POA: Diagnosis not present

## 2021-11-05 DIAGNOSIS — Z0000 Encounter for general adult medical examination without abnormal findings: Secondary | ICD-10-CM | POA: Diagnosis not present

## 2022-05-08 DIAGNOSIS — E039 Hypothyroidism, unspecified: Secondary | ICD-10-CM | POA: Diagnosis not present

## 2022-05-08 DIAGNOSIS — I10 Essential (primary) hypertension: Secondary | ICD-10-CM | POA: Diagnosis not present

## 2022-05-08 DIAGNOSIS — E7800 Pure hypercholesterolemia, unspecified: Secondary | ICD-10-CM | POA: Diagnosis not present

## 2022-11-20 DIAGNOSIS — Z23 Encounter for immunization: Secondary | ICD-10-CM | POA: Diagnosis not present

## 2022-11-20 DIAGNOSIS — I10 Essential (primary) hypertension: Secondary | ICD-10-CM | POA: Diagnosis not present

## 2022-11-20 DIAGNOSIS — E7800 Pure hypercholesterolemia, unspecified: Secondary | ICD-10-CM | POA: Diagnosis not present

## 2022-11-20 DIAGNOSIS — M2550 Pain in unspecified joint: Secondary | ICD-10-CM | POA: Diagnosis not present

## 2022-11-20 DIAGNOSIS — Z0000 Encounter for general adult medical examination without abnormal findings: Secondary | ICD-10-CM | POA: Diagnosis not present

## 2022-11-20 DIAGNOSIS — Z1211 Encounter for screening for malignant neoplasm of colon: Secondary | ICD-10-CM | POA: Diagnosis not present

## 2022-11-20 DIAGNOSIS — E039 Hypothyroidism, unspecified: Secondary | ICD-10-CM | POA: Diagnosis not present

## 2022-11-20 DIAGNOSIS — Z1331 Encounter for screening for depression: Secondary | ICD-10-CM | POA: Diagnosis not present

## 2023-01-15 DIAGNOSIS — E039 Hypothyroidism, unspecified: Secondary | ICD-10-CM | POA: Diagnosis not present

## 2023-03-26 DIAGNOSIS — E039 Hypothyroidism, unspecified: Secondary | ICD-10-CM | POA: Diagnosis not present

## 2023-12-24 DIAGNOSIS — E039 Hypothyroidism, unspecified: Secondary | ICD-10-CM | POA: Diagnosis not present

## 2023-12-24 DIAGNOSIS — Z23 Encounter for immunization: Secondary | ICD-10-CM | POA: Diagnosis not present

## 2023-12-24 DIAGNOSIS — K219 Gastro-esophageal reflux disease without esophagitis: Secondary | ICD-10-CM | POA: Diagnosis not present

## 2023-12-24 DIAGNOSIS — Z0000 Encounter for general adult medical examination without abnormal findings: Secondary | ICD-10-CM | POA: Diagnosis not present

## 2023-12-24 DIAGNOSIS — Z1331 Encounter for screening for depression: Secondary | ICD-10-CM | POA: Diagnosis not present

## 2023-12-24 DIAGNOSIS — I10 Essential (primary) hypertension: Secondary | ICD-10-CM | POA: Diagnosis not present

## 2023-12-24 DIAGNOSIS — E7800 Pure hypercholesterolemia, unspecified: Secondary | ICD-10-CM | POA: Diagnosis not present

## 2024-02-24 DIAGNOSIS — E039 Hypothyroidism, unspecified: Secondary | ICD-10-CM | POA: Diagnosis not present
